# Patient Record
Sex: Female | Born: 1995 | Race: Black or African American | Hispanic: No | Marital: Single | Smoking: Never smoker
Health system: Southern US, Community
[De-identification: ages and names within clinical notes are randomized; demographics above are authoritative.]

## PROBLEM LIST (undated history)

## (undated) DIAGNOSIS — G43909 Migraine, unspecified, not intractable, without status migrainosus: Secondary | ICD-10-CM

---
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
## 2017-07-11 IMAGING — CR DG CHEST 2V
1 series · 1 of 1 positions shown · non-contrast
Comparison: None.

CLINICAL DATA: Chest pain for several hours per

EXAM:
CHEST  2 VIEW

[w chest pa]
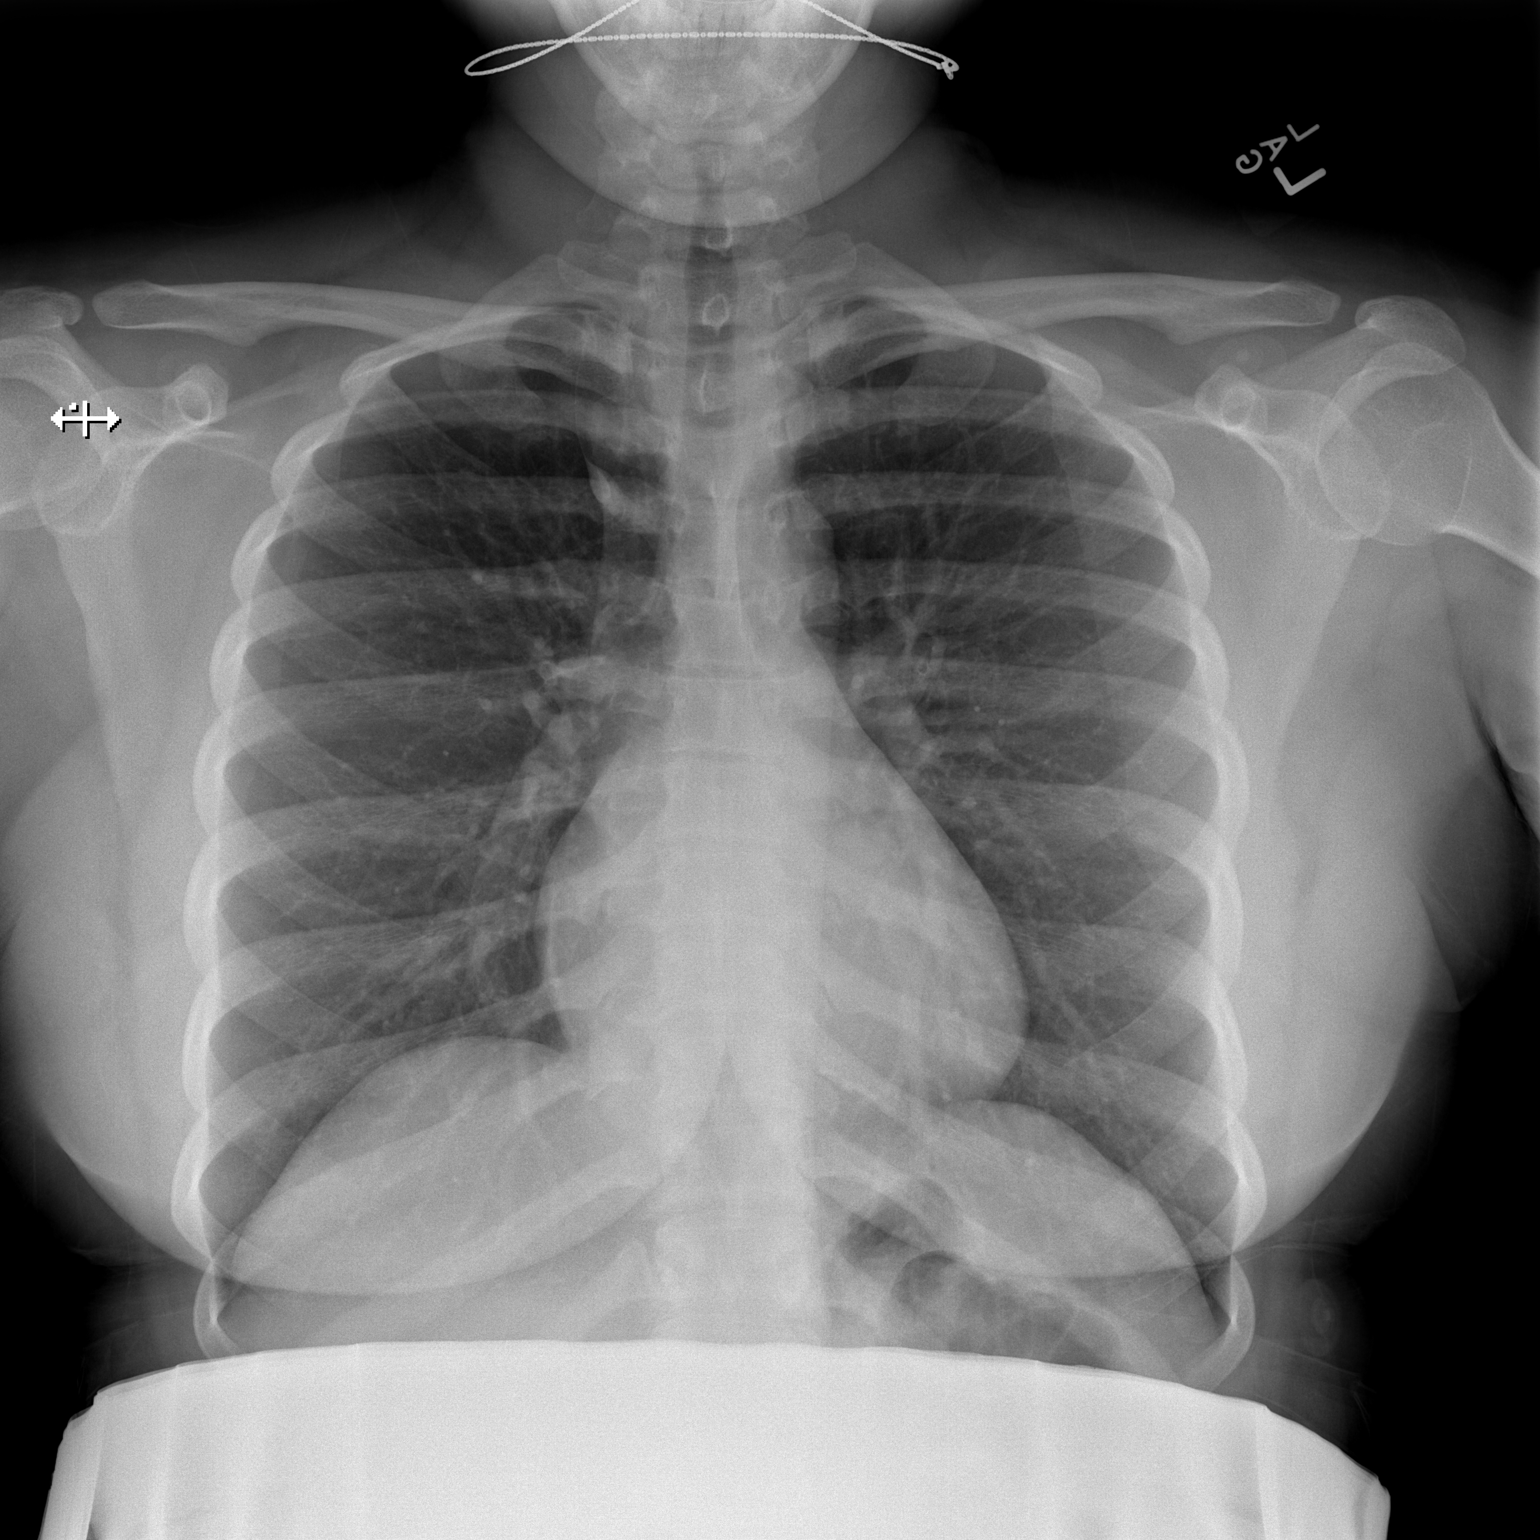

[1 of 1 positions shown; findings below may reference images not displayed]

FINDINGS: The heart size and mediastinal contours are within normal limits.
Both lungs are clear. The visualized skeletal structures are
unremarkable.
IMPRESSION: No active cardiopulmonary disease.
